# Patient Record
Sex: Male | Born: 1955 | Race: White | Hispanic: No | Marital: Single | Smoking: Former smoker
Health system: Southern US, Community
[De-identification: ages and names within clinical notes are randomized; demographics above are authoritative.]

## PROBLEM LIST (undated history)

## (undated) DIAGNOSIS — G8929 Other chronic pain: Secondary | ICD-10-CM

---

## 1991-01-29 HISTORY — PX: BELOW KNEE LEG AMPUTATION: SUR23

## 1998-05-24 ENCOUNTER — Ambulatory Visit (HOSPITAL_COMMUNITY): Admission: RE | Admit: 1998-05-24 | Discharge: 1998-05-24 | Payer: Self-pay | Admitting: Internal Medicine

## 1999-09-24 ENCOUNTER — Emergency Department (HOSPITAL_COMMUNITY): Admission: EM | Admit: 1999-09-24 | Discharge: 1999-09-24 | Payer: Self-pay | Admitting: Emergency Medicine

## 2001-07-12 ENCOUNTER — Emergency Department (HOSPITAL_COMMUNITY): Admission: EM | Admit: 2001-07-12 | Discharge: 2001-07-12 | Payer: Self-pay | Admitting: Emergency Medicine

## 2002-12-07 ENCOUNTER — Inpatient Hospital Stay (HOSPITAL_COMMUNITY): Admission: RE | Admit: 2002-12-07 | Discharge: 2002-12-12 | Payer: Self-pay | Admitting: Orthopaedic Surgery

## 2004-07-29 ENCOUNTER — Emergency Department (HOSPITAL_COMMUNITY): Admission: EM | Admit: 2004-07-29 | Discharge: 2004-07-30 | Payer: Self-pay | Admitting: Emergency Medicine

## 2004-10-28 ENCOUNTER — Emergency Department (HOSPITAL_COMMUNITY): Admission: EM | Admit: 2004-10-28 | Discharge: 2004-10-28 | Payer: Self-pay

## 2005-02-15 ENCOUNTER — Emergency Department (HOSPITAL_COMMUNITY): Admission: EM | Admit: 2005-02-15 | Discharge: 2005-02-15 | Payer: Self-pay | Admitting: Emergency Medicine

## 2009-04-01 ENCOUNTER — Emergency Department (HOSPITAL_COMMUNITY): Admission: EM | Admit: 2009-04-01 | Discharge: 2009-04-01 | Payer: Self-pay | Admitting: Emergency Medicine

## 2012-03-27 ENCOUNTER — Emergency Department (HOSPITAL_COMMUNITY)
Admission: EM | Admit: 2012-03-27 | Discharge: 2012-03-27 | Disposition: A | Discharge status: Home / Self Care | Payer: Medicare Other | Attending: Emergency Medicine | Admitting: Emergency Medicine

## 2012-03-27 ENCOUNTER — Encounter (HOSPITAL_COMMUNITY): Payer: Self-pay | Admitting: Emergency Medicine

## 2012-03-27 ENCOUNTER — Emergency Department (HOSPITAL_COMMUNITY): Payer: Medicare Other

## 2012-03-27 DIAGNOSIS — R0602 Shortness of breath: Secondary | ICD-10-CM | POA: Insufficient documentation

## 2012-03-27 DIAGNOSIS — R109 Unspecified abdominal pain: Secondary | ICD-10-CM | POA: Insufficient documentation

## 2012-03-27 DIAGNOSIS — R112 Nausea with vomiting, unspecified: Secondary | ICD-10-CM

## 2012-03-27 LAB — URINALYSIS, ROUTINE W REFLEX MICROSCOPIC
Bilirubin Urine: NEGATIVE
Glucose, UA: NEGATIVE mg/dL
Hgb urine dipstick: NEGATIVE
Leukocytes, UA: NEGATIVE

## 2012-03-27 LAB — COMPREHENSIVE METABOLIC PANEL
Albumin: 3.9 g/dL (ref 3.5–5.2)
Alkaline Phosphatase: 79 U/L (ref 39–117)
BUN: 17 mg/dL (ref 6–23)
Calcium: 9 mg/dL (ref 8.4–10.5)
Creatinine, Ser: 0.85 mg/dL (ref 0.50–1.35)
GFR calc Af Amer: >90 mL/min (ref >90)
Total Bilirubin: 0.5 mg/dL (ref 0.3–1.2)
Total Protein: 7.3 g/dL (ref 6.0–8.3)

## 2012-03-27 LAB — CBC WITH DIFFERENTIAL/PLATELET
Basophils Relative: 0 % (ref 0–1)
Eosinophils Absolute: 0.1 10*3/uL (ref 0.0–0.7)
Eosinophils Relative: 1 % (ref 0–5)
Lymphocytes Relative: 13 % (ref 12–46)
Monocytes Absolute: 0.7 10*3/uL (ref 0.1–1.0)
Neutro Abs: 8.8 10*3/uL — ABNORMAL HIGH (ref 1.7–7.7)
Neutrophils Relative %: 79 % — ABNORMAL HIGH (ref 43–77)
RBC: 5.02 MIL/uL (ref 4.22–5.81)
RDW: 13.1 % (ref 11.5–15.5)
WBC: 11.1 10*3/uL — ABNORMAL HIGH (ref 4.0–10.5)

## 2012-03-27 LAB — LIPASE, BLOOD: Lipase: 13 U/L (ref 11–59)

## 2012-03-27 MED ORDER — HYDROMORPHONE HCL PF 1 MG/ML IJ SOLN
1.0000 mg | Freq: Once | INTRAMUSCULAR | Status: AC
  Administered 2012-03-27: 1 mg via INTRAVENOUS
  Filled 2012-03-27: qty 1

## 2012-03-27 MED ORDER — PANTOPRAZOLE SODIUM 40 MG IV SOLR
40.0000 mg | Freq: Once | INTRAVENOUS | Status: AC
  Administered 2012-03-27: 40 mg via INTRAVENOUS
  Filled 2012-03-27: qty 40

## 2012-03-27 MED ORDER — SODIUM CHLORIDE 0.9 % IV BOLUS (SEPSIS)
1000.0000 mL | Freq: Once | INTRAVENOUS | Status: AC
  Administered 2012-03-27: 1000 mL via INTRAVENOUS

## 2012-03-27 MED ORDER — PROMETHAZINE HCL 25 MG PO TABS: 25.0000 mg | ORAL_TABLET | Freq: Four times a day (QID) | ORAL | Status: DC | PRN

## 2012-03-27 MED ORDER — ONDANSETRON HCL 4 MG/2ML IJ SOLN
4.0000 mg | Freq: Once | INTRAMUSCULAR | Status: AC
Start: 2012-03-27 — End: 2012-03-27
  Administered 2012-03-27: 4 mg via INTRAVENOUS
  Filled 2012-03-27: qty 2

## 2012-03-27 NOTE — ED Notes (Signed)
Pt's iv noted with bleeding on pt's gown and blanket. [place], charge rn, went into pt's room to reinforce iv site. Pt's gown and blankets changed.

## 2012-03-27 NOTE — ED Notes (Signed)
ZOX:[id number]<AV> Expected date:03/27/12<BR> Expected time: 9:30 AM<BR> Means of arrival:Ambulance<BR> Comments:<BR> 57yoM/n/v
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
## 2012-03-27 NOTE — Progress Notes (Signed)
Pt initially listed as self pay Cm made referral to [facility] liaison who saw pt but found out pt is a Medicare pt This information provided to [facility] ED registration for updating in EPIC.

## 2012-03-27 NOTE — ED Notes (Signed)
Per EMS-pt from Methadone clinic with c/o of nausea, vomiting, abd pain.

## 2012-03-27 NOTE — ED Notes (Signed)
Pt actively vomiting.

## 2012-03-27 NOTE — ED Notes (Signed)
PO challenge tolerated well.

## 2012-03-28 NOTE — ED Provider Notes (Signed)
Medical screening examination/treatment/procedure(s) were performed by non-physician practitioner and as supervising physician I was immediately available for consultation/collaboration.   Ulonda Klosowski L Octivia Canion, MD 03/28/12 0732 

## 2014-03-08 IMAGING — CR DG ABDOMEN ACUTE W/ 1V CHEST
5 series · 5 of 5 positions shown · non-contrast
Comparison: None.

CLINICAL DATA: Abdominal pain.

ACUTE ABDOMEN SERIES (ABDOMEN 2 VIEW & CHEST 1 VIEW)

[w abdomen decub]
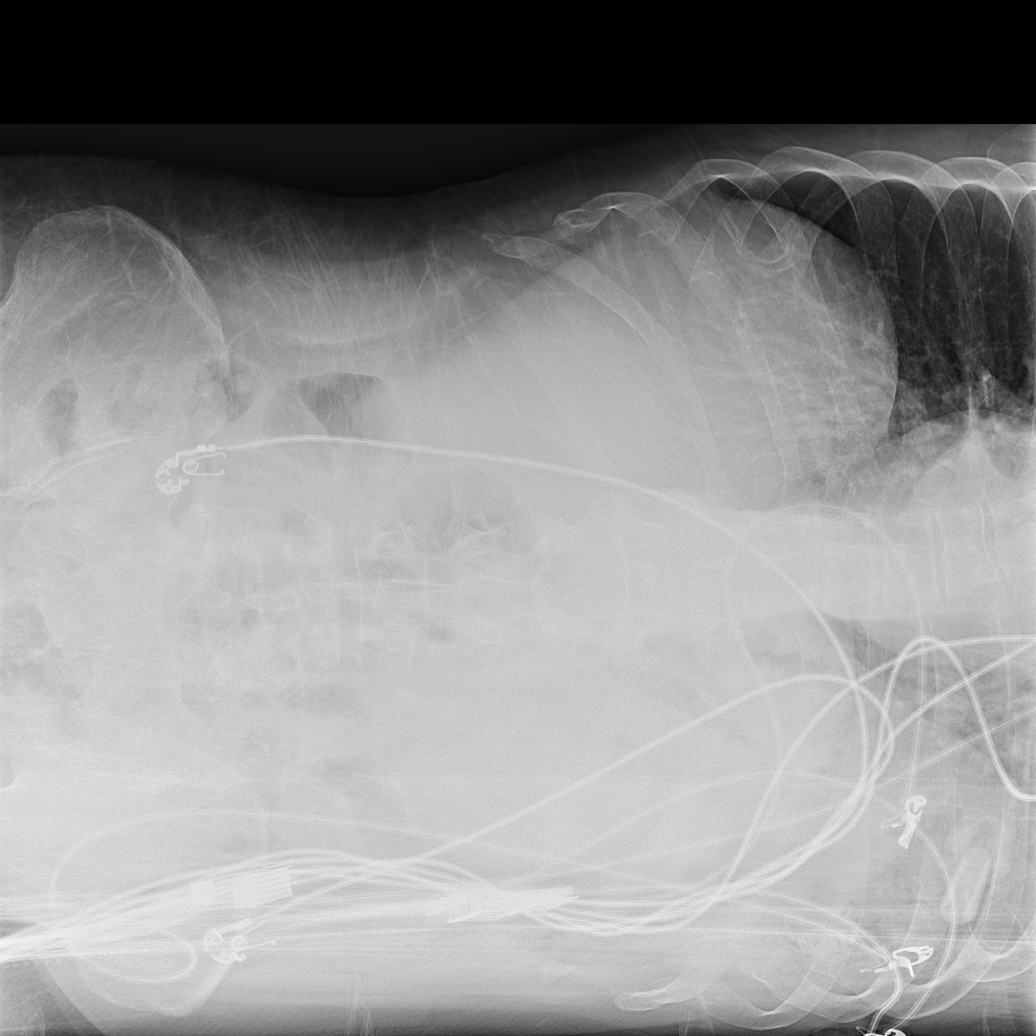

[x abdomen supine (1 of 3)]
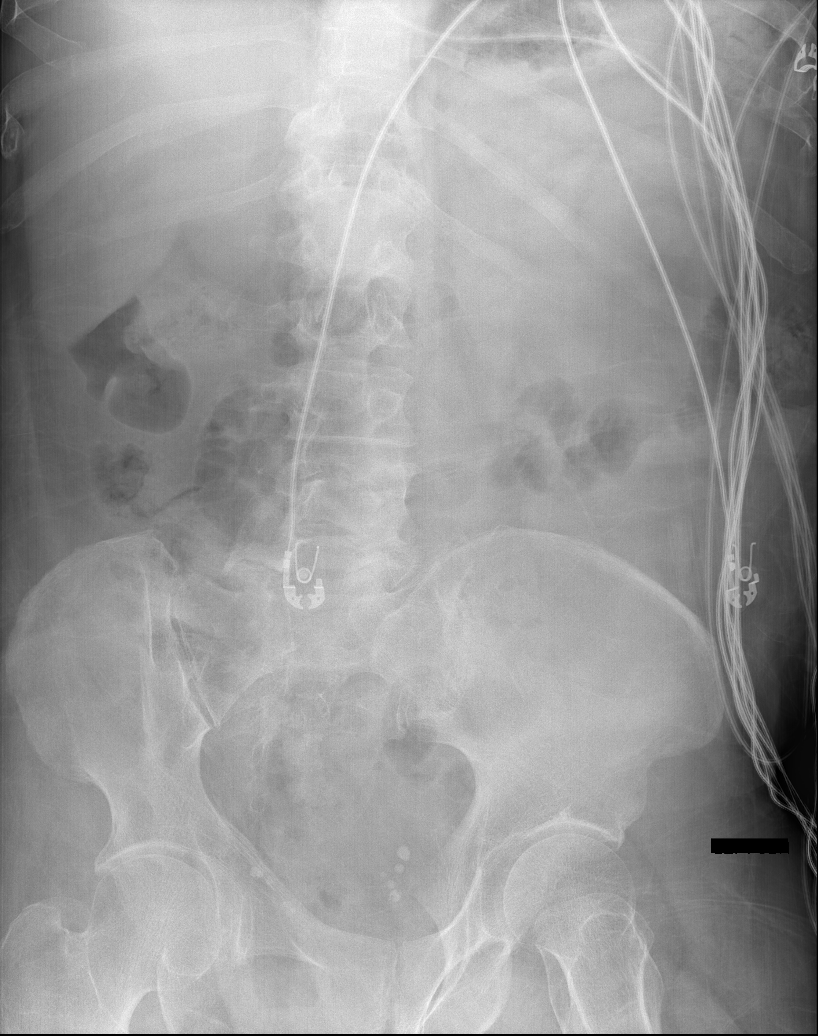

[x abdomen supine (2 of 3)]
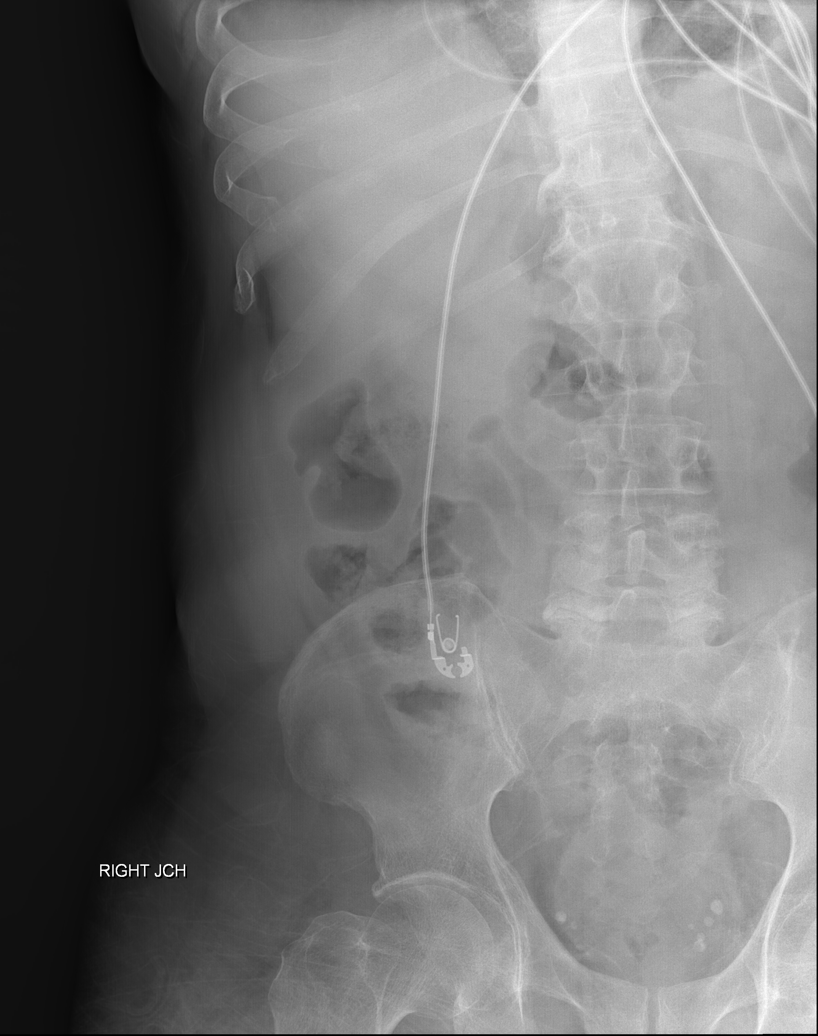

[x abdomen supine (3 of 3)]
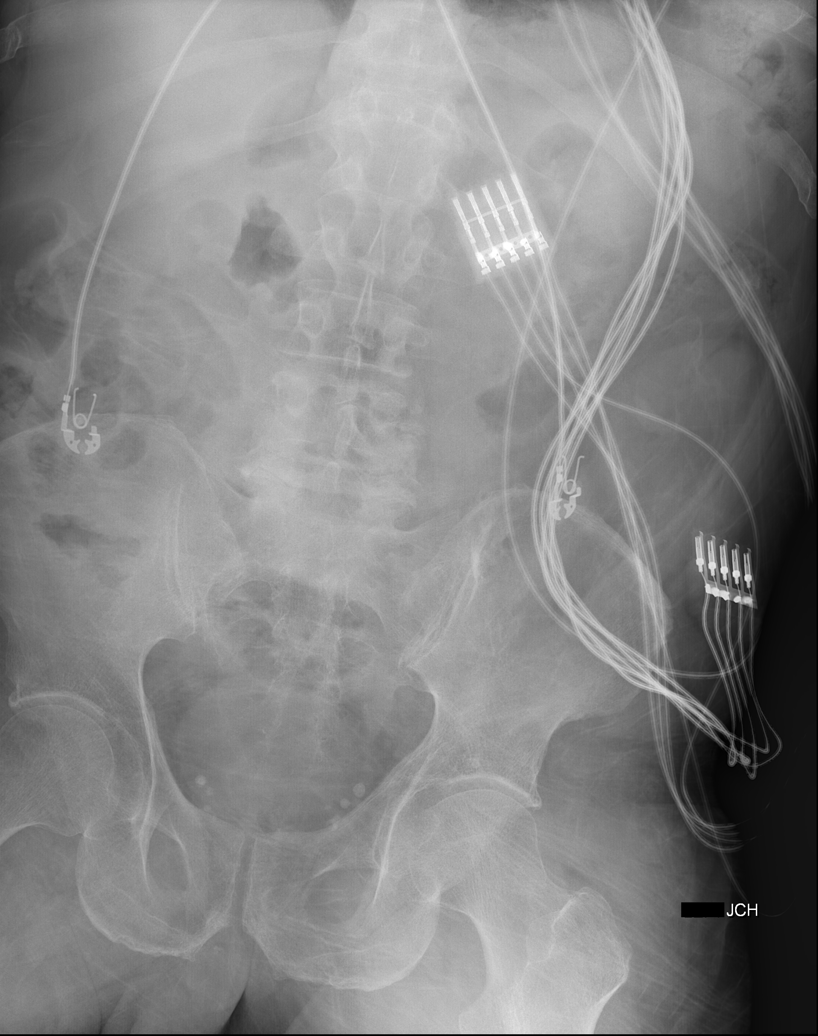

[x chest ap]
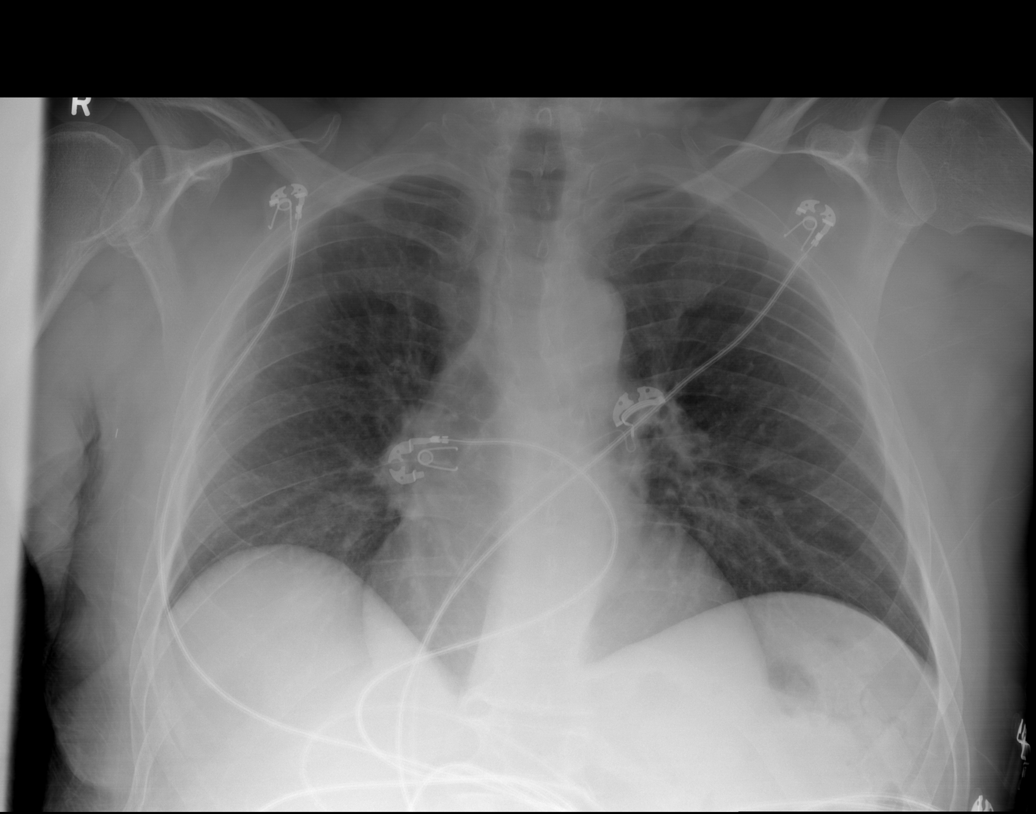

[5 of 5 positions shown; findings below may reference images not displayed]

FINDINGS: The heart size is normal.  The lung volumes are low.

Supine and decubitus views of the abdomen demonstrate a nonspecific
bowel gas pattern.  The there is no evidence for obstruction or
free air.  Mild degenerative changes are noted in the lower lumbar
spine.
IMPRESSION: 1.  No acute abnormality of the chest or abdomen.
2.  Degenerative changes of the lower lumbar spine.

## 2014-04-06 ENCOUNTER — Encounter (HOSPITAL_COMMUNITY): Payer: Self-pay | Admitting: *Deleted

## 2014-04-06 ENCOUNTER — Emergency Department (HOSPITAL_COMMUNITY)
Admission: EM | Admit: 2014-04-06 | Discharge: 2014-04-06 | Disposition: A | Discharge status: Home / Self Care | Payer: Medicare Other | Attending: Emergency Medicine | Admitting: Emergency Medicine

## 2014-04-06 DIAGNOSIS — I209 Angina pectoris, unspecified: Secondary | ICD-10-CM | POA: Diagnosis not present

## 2014-04-06 DIAGNOSIS — R110 Nausea: Secondary | ICD-10-CM | POA: Diagnosis not present

## 2014-04-06 DIAGNOSIS — G8929 Other chronic pain: Secondary | ICD-10-CM | POA: Insufficient documentation

## 2014-04-06 DIAGNOSIS — Z7982 Long term (current) use of aspirin: Secondary | ICD-10-CM | POA: Insufficient documentation

## 2014-04-06 DIAGNOSIS — R61 Generalized hyperhidrosis: Secondary | ICD-10-CM | POA: Diagnosis not present

## 2014-04-06 DIAGNOSIS — R079 Chest pain, unspecified: Secondary | ICD-10-CM | POA: Diagnosis present

## 2014-04-06 HISTORY — DX: Other chronic pain: G89.29

## 2014-04-06 LAB — TROPONIN I: Troponin I: <0.03 ng/mL (ref <0.031)

## 2014-04-06 LAB — CBC WITH DIFFERENTIAL/PLATELET
BASOS ABS: 0 10*3/uL (ref 0.0–0.1)
BASOS PCT: 0 % (ref 0–1)
Eosinophils Absolute: 0.1 10*3/uL (ref 0.0–0.7)
Eosinophils Relative: 2 % (ref 0–5)
HEMATOCRIT: 40.9 % (ref 39.0–52.0)
Hemoglobin: 13 g/dL (ref 13.0–17.0)
LYMPHS ABS: 1.6 10*3/uL (ref 0.7–4.0)
Lymphocytes Relative: 23 % (ref 12–46)
MCH: 27.6 pg (ref 26.0–34.0)
MCHC: 31.8 g/dL (ref 30.0–36.0)
MCV: 86.8 fL (ref 78.0–100.0)
MONO ABS: 0.3 10*3/uL (ref 0.1–1.0)
Monocytes Relative: 5 % (ref 3–12)
NEUTROS ABS: 4.8 10*3/uL (ref 1.7–7.7)
NEUTROS PCT: 70 % (ref 43–77)
PLATELETS: 183 10*3/uL (ref 150–400)
RBC: 4.71 MIL/uL (ref 4.22–5.81)
RDW: 13.5 % (ref 11.5–15.5)
WBC: 7 10*3/uL (ref 4.0–10.5)

## 2014-04-06 LAB — BASIC METABOLIC PANEL
Anion gap: 7 (ref 5–15)
BUN: 14 mg/dL (ref 6–23)
CALCIUM: 9.1 mg/dL (ref 8.4–10.5)
CHLORIDE: 107 mmol/L (ref 96–112)
CO2: 26 mmol/L (ref 19–32)
Creatinine, Ser: 0.88 mg/dL (ref 0.50–1.35)
Glucose, Bld: 93 mg/dL (ref 70–99)
POTASSIUM: 4 mmol/L (ref 3.5–5.1)
SODIUM: 140 mmol/L (ref 135–145)

## 2014-04-06 MED ORDER — ASPIRIN 81 MG PO CHEW
162.0000 mg | CHEWABLE_TABLET | Freq: Once | ORAL | Status: AC
  Administered 2014-04-06: 162 mg via ORAL
  Filled 2014-04-06: qty 2

## 2014-04-06 NOTE — ED Notes (Signed)
Pt reports episode yesterday for mid chest pain and sob, felt "like he was having a heart attack" but it eased off.

## 2014-04-06 NOTE — ED Notes (Signed)
Pt placed in gown and in bed. Pt monitored by pulse ox, bp cuff, and 12-lead. MD resident at bedside.

## 2014-04-06 NOTE — Discharge Instructions (Signed)
We saw you in the ER for the chest pain/shortness of breath. All of our cardiac workup is normal, including labs, EKG and chest X-RAY are normal. The workup in the ER is not complete, and SO WE HAVE CONTACTED THE CARDIOLOGY TEAM, and they will contact you for a stress test.  IF YOUR SYMPTOMS RETURN, COME TO THE ER IMMEDIATELY.   Acute Coronary Syndrome Acute coronary syndrome (ACS) is an urgent problem in which the blood and oxygen supply to the heart is critically deficient. ACS requires hospitalization because one or more coronary arteries may be blocked. ACS represents a range of conditions including:  Previous angina that is now unstable, lasts longer, happens at rest, or is more intense.  A heart attack, with heart muscle cell injury and death. There are three vital coronary arteries that supply the heart muscle with blood and oxygen so that it can pump blood effectively. If blockages to these arteries develop, blood flow to the heart muscle is reduced. If the heart does not get enough blood, angina may occur as the first warning sign. SYMPTOMS   The most common signs of angina include:  Tightness or squeezing in the chest.  Feeling of heaviness on the chest.  Discomfort in the arms, neck, back, or jaw.  Shortness of breath and nausea.  Cold, wet skin.  Angina is usually brought on by physical effort or excitement which increase the oxygen needs of the heart. These states increase the blood flow needs of the heart beyond what can be delivered.  Other symptoms that are not as common include:  Fatigue  Unexplained feelings of nervousness or anxiety  Weakness  Diarrhea  Sometimes, you may not have noticed any symptoms at all but still suffered a cardiac injury. TREATMENT   Medicines to help discomfort may include nitroglycerin (nitro) in the form of tablets or a spray for rapid relief, or longer-acting forms such as cream, patches, or capsules. (Be aware that there are  many side effects and possible interactions with other drugs).  Other medicines may be used to help the heart pump better.  Procedures to open blocked arteries including angioplasty or stent placement to keep the arteries open.  Open heart surgery may be needed when there are many blockages or they are in critical locations that are best treated with surgery. HOME CARE INSTRUCTIONS   Do not use any tobacco products including cigarettes, chewing tobacco, or electronic cigarettes.  Take one baby or adult aspirin daily, if your health care provider advises. This helps reduce the risk of a heart attack.  It is very important that you follow the angina treatment prescribed by your health care provider. Make arrangements for proper follow-up care.  Eat a heart healthy diet with salt and fat restrictions as advised.  Regular exercise is good for you as [place] as it does not cause discomfort. Do not begin any new type of exercise until you check with your health care provider.  If you are overweight, you should lose weight.  Try to maintain normal blood lipid levels.  Keep your blood pressure under control as recommended by your health care provider.  You should tell your health care provider right away about any increase in the severity or frequency of your chest discomfort or angina attacks. When you have angina, you should stop what you are doing and sit down. This may bring relief in 3 to 5 minutes. If your health care provider has prescribed nitro, take it as directed.  If your health care provider has given you a follow-up appointment, it is very important to keep that appointment. Not keeping the appointment could result in a chronic or permanent injury, pain, and disability. If there is any problem keeping the appointment, you must call back to this facility for assistance. SEEK IMMEDIATE MEDICAL CARE IF:   You develop nausea, vomiting, or shortness of breath.  You feel faint,  lightheaded, or pass out.  Your chest discomfort gets worse.  You are sweating or experience sudden profound fatigue.  You do not get relief of your chest pain after 3 doses of nitro.  Your discomfort lasts longer than 15 minutes. MAKE SURE YOU:   Understand these instructions.  Will watch your condition.  Will get help right away if you are not doing well or get worse.  Take all medicines as directed by your health care provider. Document Released: 01/14/2005 Document Revised: 01/19/2013 Document Reviewed: 05/18/2013 [facility] Patient Information 2015 [place], [place]. This information is not intended to replace advice given to you by your health care provider. Make sure you discuss any questions you have with your health care provider.

## 2014-04-06 NOTE — ED Provider Notes (Addendum)
CSN: [id number]     Arrival date & time 04/06/14  4098 History   First MD Initiated Contact with Patient 04/06/14 [phone]     Chief Complaint  Patient presents with  . Chest Pain     (Consider location/radiation/quality/duration/timing/severity/associated sxs/prior Treatment) HPI Comments: 59 yo M presents complaining of an episode of chest pain that began last night at rest. He describes the pain as a pressure-like sensation in the lower substernal and epigastric region, radiating to the left chest and back between the scapula blades. Associated symptoms included intense nausea but no vomiting, and diaphoresis. Pt had his pain unprovoked, and it lasted for about 10 minutes and then resolved. No repeat episodes since then. Denies ever having a similar episode of pain in the past and denies any history of cardiac events. Denies SOB associated with the event. Patient called EMS but by the time they arrived the pain had subsided and pt declined to come to the ER immediately. Denies substance abuse or premature CAD in the family.  Patient is a 59 y.o. male presenting with chest pain. The history is provided by the patient.  Chest Pain Associated symptoms: back pain, diaphoresis and nausea   Associated symptoms: no abdominal pain, no cough, no shortness of breath, not vomiting and no weakness     Past Medical History  Diagnosis Date  . Chronic pain    Past Surgical History  Procedure Laterality Date  . Below knee leg amputation     History reviewed. No pertinent family history. History  Substance Use Topics  . Smoking status: Not on file  . Smokeless tobacco: Not on file  . Alcohol Use: No    Review of Systems  Constitutional: Positive for diaphoresis. Negative for activity change and appetite change.  Respiratory: Negative for cough and shortness of breath.   Cardiovascular: Positive for chest pain.  Gastrointestinal: Positive for nausea. Negative for vomiting and abdominal pain.   Genitourinary: Negative for dysuria.  Musculoskeletal: Positive for back pain.  Allergic/Immunologic: Negative for immunocompromised state.  Neurological: Negative for weakness.      Allergies  Review of patient's allergies indicates no known allergies.  Home Medications   Prior to Admission medications   Medication Sig Start Date End Date Taking? Authorizing Provider  aspirin 81 MG tablet Take 81 mg by mouth daily as needed for pain (headache).   Yes Historical Provider, MD  METHADONE HCL PO Take 230 mg by mouth daily. Methadone liquid. Pt gets [facility] from [facility]. [facility]   Yes Historical Provider, MD  promethazine (PHENERGAN) 25 MG tablet Take 1 tablet (25 mg total) by mouth every 6 (six) hours as needed for nausea. Patient not taking: Reported on 04/06/2014 03/27/12   Junious Silk, PA-C   BP 107/66 mmHg  Pulse 55  Temp(Src) 98.2 F (36.8 C) (Oral)  Resp 18  Ht  (1.676 m)  Wt 180 lb (81.647 kg)  BMI 29.07 kg/m2  SpO2 95% Physical Exam  Constitutional: He is oriented to person, place, and time. He appears well-developed.  HENT:  Head: Normocephalic and atraumatic.  Eyes: Conjunctivae and EOM are normal. Pupils are equal, round, and reactive to light.  Neck: Normal range of motion. Neck supple.  Cardiovascular: Normal rate and regular rhythm.   Pulmonary/Chest: Effort normal and breath sounds normal.  Abdominal: Soft. Bowel sounds are normal. He exhibits no distension. There is no tenderness. There is no rebound and no guarding.  Neurological: He is  alert and oriented to person, place, and time.  Skin: Skin is warm.  Nursing note and vitals reviewed.   ED Course  Procedures (including critical care time) Labs Review Labs Reviewed  CBC WITH DIFFERENTIAL/PLATELET  BASIC METABOLIC PANEL  TROPONIN I  TROPONIN I    Imaging Review No results found.   EKG Interpretation   Date/Time:  Wednesday April 06 2014 07:34:14 EST Ventricular Rate:  77 PR Interval:  163 QRS Duration: 148 QT Interval:  513 QTC Calculation: 581 R Axis:   -57 Text Interpretation:  Sinus rhythm RBBB and LAFB No significant change  since last tracing Confirmed by Avi Kerschner, MD, Jerolene Kupfer [phone]([id number]) on 04/06/2014  8:18:00 AM      MDM   Final diagnoses:  Angina pectoris    Pt comes in with cc of chest pain. Chest pain has some typical features. Pt had singular episode, lasted 10 minutes. He does indicate having some indigestion like symptoms for the past 1-2 weeks. No exertional component to the pain, and the patient is currently chest pain free.  HEAR SCORE IS: 4 - with risk factor of smoking  History  Highly suspicious 2   ECG Non specific repolarisation disturbance 0  AGE 89 - 65 years 1    Risk Factors  1 or 2 risk factors 1  Based on the concerning story - but just 1 isolated episode, and extremely low risk factors-  trops x 2 ordered. I spoke with Cards none the less, and they will schedule an outpatient stress. Dr. Tresa EndoKelly will see the patient post stress.  Strict return precautions discussed in the Er. Pt has remained chest pain free, and he is happy with the plan.    Derwood KaplanAnkit Zenda Herskowitz, MD 04/06/14 1344  Derwood KaplanAnkit Azar South, MD 04/06/14 1344

## 2014-04-06 NOTE — ED Notes (Signed)
Pt reports diffuse chest pain, onset yesterday. Also reports SOB, diaphoresis and nausea. Denies pain upon arrival to ED. Pt also reports issues with acid reflux. Respirations unlabored. Speaking complete sentences. Pt is alert and oriented x4.

## 2014-05-30 ENCOUNTER — Ambulatory Visit (INDEPENDENT_AMBULATORY_CARE_PROVIDER_SITE_OTHER): Payer: Medicare Other | Admitting: Physician Assistant

## 2014-05-30 ENCOUNTER — Encounter: Payer: Self-pay | Admitting: Physician Assistant

## 2014-05-30 VITALS — BP 128/82 | HR 68 | Ht 65.5 in | Wt 187.5 lb

## 2014-05-30 DIAGNOSIS — I209 Angina pectoris, unspecified: Secondary | ICD-10-CM | POA: Diagnosis not present

## 2014-05-30 DIAGNOSIS — R931 Abnormal findings on diagnostic imaging of heart and coronary circulation: Secondary | ICD-10-CM | POA: Diagnosis not present

## 2014-05-30 DIAGNOSIS — R079 Chest pain, unspecified: Secondary | ICD-10-CM

## 2014-05-30 DIAGNOSIS — E785 Hyperlipidemia, unspecified: Secondary | ICD-10-CM | POA: Diagnosis not present

## 2014-05-30 DIAGNOSIS — F1120 Opioid dependence, uncomplicated: Secondary | ICD-10-CM

## 2014-05-30 DIAGNOSIS — R0789 Other chest pain: Secondary | ICD-10-CM

## 2014-05-30 MED ORDER — NITROGLYCERIN 0.4 MG SL SUBL: 0.4000 mg | SUBLINGUAL_TABLET | SUBLINGUAL | Status: AC | PRN

## 2014-05-30 NOTE — Assessment & Plan Note (Signed)
The patient has had 4 episode since the first one in March.  None have been as severe.  Will order a Lexiscan and check lipids.  +FH, +tobacco use.

## 2014-05-30 NOTE — Patient Instructions (Signed)
START NTG as needed for chest pain.  Your physician has requested that you have a lexiscan myoview. For further information please visit [email or web address]. Please follow instruction sheet, as given.  Your physician recommends that you schedule a follow-up appointment in: Dr. [place] for test results.  Nitroglycerin sublingual tablets What is this medicine? NITROGLYCERIN (nye troe GLI ser in) is a type of vasodilator. It relaxes blood vessels, increasing the blood and oxygen supply to your heart. This medicine is used to relieve chest pain caused by angina. It is also used to prevent chest pain before activities like climbing stairs, going outdoors in cold weather, or sexual activity. This medicine may be used for other purposes; ask your health care provider or pharmacist if you have questions. COMMON BRAND NAME(S): Nitroquick, Nitrostat, Nitrotab What should I tell my health care provider before I take this medicine? They need to know if you have any of these conditions: -anemia -head injury, recent stroke, or bleeding in the brain -liver disease -previous heart attack -an unusual or allergic reaction to nitroglycerin, other medicines, foods, dyes, or preservatives -pregnant or trying to get pregnant -breast-feeding How should I use this medicine? Take this medicine by mouth as needed. At the first sign of an angina attack (chest pain or tightness) place one tablet under your tongue. You can also take this medicine 5 to 10 minutes before an event likely to produce chest pain. Follow the directions on the prescription label. Let the tablet dissolve under the tongue. Do not swallow whole. Replace the dose if you accidentally swallow it. It will help if your mouth is not dry. Saliva around the tablet will help it to dissolve more quickly. Do not eat or drink, smoke or chew tobacco while a tablet is dissolving. If you are not better within 5 minutes after taking ONE dose of nitroglycerin, call  9-1-1 immediately to seek emergency medical care. Do not take more than 3 nitroglycerin tablets over 15 minutes. If you take this medicine often to relieve symptoms of angina, your doctor or health care professional may provide you with different instructions to manage your symptoms. If symptoms do not go away after following these instructions, it is important to call 9-1-1 immediately. Do not take more than 3 nitroglycerin tablets over 15 minutes. Talk to your pediatrician regarding the use of this medicine in children. Special care may be needed. Overdosage: If you think you have taken too much of this medicine contact a poison control center or emergency room at once. NOTE: This medicine is only for you. Do not share this medicine with others. What if I miss a dose? This does not apply. This medicine is only used as needed. What may interact with this medicine? Do not take this medicine with any of the following medications: -certain migraine medicines like ergotamine and dihydroergotamine (DHE) -medicines used to treat erectile dysfunction like sildenafil, tadalafil, and vardenafil -riociguat This medicine may also interact with the following medications: -alteplase -aspirin -heparin -medicines for high blood pressure -medicines for mental depression -other medicines used to treat angina -phenothiazines like chlorpromazine, mesoridazine, prochlorperazine, thioridazine This list may not describe all possible interactions. Give your health care provider a list of all the medicines, herbs, non-prescription drugs, or dietary supplements you use. Also tell them if you smoke, drink alcohol, or use illegal drugs. Some items may interact with your medicine. What should I watch for while using this medicine? Tell your doctor or health care professional if you feel your medicine is  no longer working. Keep this medicine with you at all times. Sit or lie down when you take your medicine to prevent  falling if you feel dizzy or faint after using it. Try to remain calm. This will help you to feel better faster. If you feel dizzy, take several deep breaths and lie down with your feet propped up, or bend forward with your head resting between your knees. You may get drowsy or dizzy. Do not drive, use machinery, or do anything that needs mental alertness until you know how this drug affects you. Do not stand or sit up quickly, especially if you are an older patient. This reduces the risk of dizzy or fainting spells. Alcohol can make you more drowsy and dizzy. Avoid alcoholic drinks. Do not treat yourself for coughs, colds, or pain while you are taking this medicine without asking your doctor or health care professional for advice. Some ingredients may increase your blood pressure. What side effects may I notice from receiving this medicine? Side effects that you should report to your doctor or health care professional as soon as possible: -blurred vision -dry mouth -skin rash -sweating -the feeling of extreme pressure in the head -unusually weak or tired Side effects that usually do not require medical attention (report to your doctor or health care professional if they continue or are bothersome): -flushing of the face or neck -headache -irregular heartbeat, palpitations -nausea, vomiting This list may not describe all possible side effects. Call your doctor for medical advice about side effects. You may report side effects to FDA at 1-800-FDA-1088. Where should I keep my medicine? Keep out of the reach of children. Store at room temperature between 20 and 25 degrees C (68 and 77 degrees F). Store in Retail buyer. Protect from light and moisture. Keep tightly closed. Throw away any unused medicine after the expiration date. NOTE: This sheet is a summary. It may not cover all possible information. If you have questions about this medicine, talk to your doctor, pharmacist, or health care  provider.  2015, [employer] Standard. (2012-11-12 17:57:36)
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
## 2014-05-31 ENCOUNTER — Telehealth: Payer: Self-pay

## 2014-05-31 NOTE — Telephone Encounter (Signed)
Patient called no answer.Left message on personal voice mail received a message needs a follow up appointment with Dr.Jordan after myoview.Advised to call me back to schedule.

## 2014-06-15 ENCOUNTER — Encounter (HOSPITAL_COMMUNITY): Payer: Medicare Other

## 2014-06-30 ENCOUNTER — Ambulatory Visit: Payer: Medicare Other | Admitting: Cardiology

## 2014-07-01 ENCOUNTER — Telehealth: Payer: Self-pay

## 2014-07-01 NOTE — Telephone Encounter (Signed)
Patient called no answer.Left message on personal voice mail I received a message myoview and follow up appt with Dr.Jordan canceled.Advised to call back if he wants to reschedule.

## 2019-12-29 DEATH — deceased
